# Patient Record
Sex: Female | Born: 1990 | Race: White | Hispanic: No | Marital: Single | Smoking: Current every day smoker
Health system: Southern US, Community
[De-identification: ages and names within clinical notes are randomized; demographics above are authoritative.]

## PROBLEM LIST (undated history)

## (undated) ENCOUNTER — Inpatient Hospital Stay: Admission: EM | Payer: Self-pay | Source: Home / Self Care

## (undated) ENCOUNTER — Inpatient Hospital Stay (HOSPITAL_COMMUNITY): Payer: Medicaid Other

## (undated) DIAGNOSIS — Z8673 Personal history of transient ischemic attack (TIA), and cerebral infarction without residual deficits: Secondary | ICD-10-CM

## (undated) DIAGNOSIS — F411 Generalized anxiety disorder: Secondary | ICD-10-CM

## (undated) DIAGNOSIS — F329 Major depressive disorder, single episode, unspecified: Secondary | ICD-10-CM

## (undated) DIAGNOSIS — J45909 Unspecified asthma, uncomplicated: Secondary | ICD-10-CM

## (undated) DIAGNOSIS — R0683 Snoring: Secondary | ICD-10-CM

## (undated) DIAGNOSIS — G5622 Lesion of ulnar nerve, left upper limb: Secondary | ICD-10-CM

## (undated) DIAGNOSIS — R825 Elevated urine levels of drugs, medicaments and biological substances: Secondary | ICD-10-CM

## (undated) DIAGNOSIS — R454 Irritability and anger: Secondary | ICD-10-CM

## (undated) DIAGNOSIS — R200 Anesthesia of skin: Secondary | ICD-10-CM

## (undated) DIAGNOSIS — E669 Obesity, unspecified: Secondary | ICD-10-CM

## (undated) HISTORY — DX: Snoring: R06.83

## (undated) HISTORY — DX: Personal history of transient ischemic attack (TIA), and cerebral infarction without residual deficits: Z86.73

## (undated) HISTORY — DX: Irritability and anger: R45.4

## (undated) HISTORY — DX: Generalized anxiety disorder: F41.1

## (undated) HISTORY — DX: Major depressive disorder, single episode, unspecified: F32.9

## (undated) HISTORY — DX: Lesion of ulnar nerve, left upper limb: G56.22

## (undated) HISTORY — DX: Elevated urine levels of drugs, medicaments and biological substances: R82.5

## (undated) HISTORY — DX: Obesity, unspecified: E66.9

## (undated) HISTORY — DX: Anesthesia of skin: R20.0

## (undated) HISTORY — PX: DILATION AND EVACUATION: SHX1459

---

## 2014-05-11 ENCOUNTER — Other Ambulatory Visit: Payer: Self-pay

## 2014-05-29 ENCOUNTER — Other Ambulatory Visit (HOSPITAL_COMMUNITY): Payer: Self-pay | Admitting: Obstetrics and Gynecology

## 2014-05-29 ENCOUNTER — Encounter (HOSPITAL_COMMUNITY): Payer: Self-pay

## 2014-05-29 ENCOUNTER — Ambulatory Visit (HOSPITAL_COMMUNITY)
Admission: RE | Admit: 2014-05-29 | Discharge: 2014-05-29 | Disposition: A | Discharge status: Home / Self Care | Payer: Medicaid Other | Source: Ambulatory Visit | Attending: Obstetrics and Gynecology | Admitting: Obstetrics and Gynecology

## 2014-05-29 VITALS — BP 121/76 | HR 82 | Wt 299.0 lb

## 2014-05-29 DIAGNOSIS — O283 Abnormal ultrasonic finding on antenatal screening of mother: Secondary | ICD-10-CM

## 2014-05-29 DIAGNOSIS — O358XX0 Maternal care for other (suspected) fetal abnormality and damage, not applicable or unspecified: Secondary | ICD-10-CM | POA: Insufficient documentation

## 2014-05-29 DIAGNOSIS — O289 Unspecified abnormal findings on antenatal screening of mother: Secondary | ICD-10-CM | POA: Insufficient documentation

## 2014-05-29 NOTE — Progress Notes (Signed)
MATERNAL FETAL MEDICINE CONSULT  Patient Name: Shelly Moyer Medical Record Number:  [id number] Date of Birth: 03/07/1991 Requesting Physician Name:  Hassell Doneraig Gaccione, MD Date of Service: 05/29/2014  Chief Complaint Fetal anencephaly  History of Present Illness Shelly Moyer was seen today secondary to fetal anencephaly at the request of Hassell Doneraig Gaccione, MD.  The patient is a 23 y.o. G2P0010,at 8069w1d with an EDD of 12/03/2014, by Ultrasound dating method.  This was discovered on a routine ultrasound performed in Hassell Doneraig Gaccione, MD office.  Anencephaly was [facility] on an [facility]  Review of Systems Pertinent items are noted in HPI.  Obstetrical History G1 TAB as a teenager G2 Current  Past Medical and Surgical History The patient asthma, but no other history of chronic medical diseases.  She has had several teeth extracted but no other prior surgeries.  Family History The patient has no family history of mental retardation, birth defects, or genetic diseases.  She does not know the family history of the father of the baby.  Social History The patient smokes, but does not use  alcohol or other illicit drugs.  Physical Examination Vitals - BP121/76, Pulse 82, Weight 299 lbs. General appearance - alert, well appearing, and in no distress Abdomen - soft, nontender, nondistended, no masses or organomegaly Extremities - no pedal edema noted  Assessment and Recommendations 1.  Fetal anencephaly.  The suspicions raised by Shelly Moyer prior ultrasound were confirmed on our ultrasound today.  I discussed the implications of these findings at length with Shelly Moyer and her mother, who accompanied her to today's visit.  She understands that this is a lethal condition, and that while fetuses with anencephaly typical survive until term they typically die within hours and days of birth if not provided with intensive life support measures.  We also discussed her  options of continuing or terminating the pregnancy.  She would like to proceed with termination of pregnancy via D&C.  I have discussed this case with Dr. Marice Potterove with the faculty practice.  She has arranged for Dr. Jolayne Pantheronstant [facility] coming Friday 06/01/14.  I completed the Women's Right to Know paperwork with Shelly Moyer today at 1300.  I spent 45 minutes with Shelly Moyer today of which 50% was face-to-face counseling.  [facility].  Please do not hesitate to contact us with questions.   Rema FendtNITSCHE,JOSHUA, MD

## 2014-05-31 ENCOUNTER — Encounter (HOSPITAL_COMMUNITY): Payer: Self-pay | Admitting: *Deleted

## 2014-06-01 ENCOUNTER — Ambulatory Visit (HOSPITAL_COMMUNITY): Payer: Medicaid Other | Admitting: Anesthesiology

## 2014-06-01 ENCOUNTER — Ambulatory Visit (HOSPITAL_COMMUNITY)
Admission: RE | Admit: 2014-06-01 | Discharge: 2014-06-01 | Disposition: A | Discharge status: Home / Self Care | Payer: Medicaid Other | Source: Ambulatory Visit | Attending: Obstetrics and Gynecology | Admitting: Obstetrics and Gynecology

## 2014-06-01 ENCOUNTER — Encounter (HOSPITAL_COMMUNITY)
Admission: RE | Disposition: A | Discharge status: Home / Self Care | Payer: Self-pay | Source: Ambulatory Visit | Attending: Obstetrics and Gynecology

## 2014-06-01 ENCOUNTER — Encounter (HOSPITAL_COMMUNITY): Payer: Medicaid Other | Admitting: Anesthesiology

## 2014-06-01 ENCOUNTER — Encounter (HOSPITAL_COMMUNITY): Payer: Self-pay | Admitting: Certified Registered"

## 2014-06-01 DIAGNOSIS — Z6841 Body Mass Index (BMI) 40.0 and over, adult: Secondary | ICD-10-CM | POA: Diagnosis not present

## 2014-06-01 DIAGNOSIS — F17200 Nicotine dependence, unspecified, uncomplicated: Secondary | ICD-10-CM | POA: Diagnosis not present

## 2014-06-01 DIAGNOSIS — O350XX0 Maternal care for (suspected) central nervous system malformation in fetus, not applicable or unspecified: Secondary | ICD-10-CM

## 2014-06-01 DIAGNOSIS — O350XX1 Maternal care for (suspected) central nervous system malformation in fetus, fetus 1: Secondary | ICD-10-CM

## 2014-06-01 DIAGNOSIS — Z332 Encounter for elective termination of pregnancy: Secondary | ICD-10-CM | POA: Diagnosis present

## 2014-06-01 DIAGNOSIS — O3500X0 Maternal care for (suspected) central nervous system malformation or damage in fetus, unspecified, not applicable or unspecified: Secondary | ICD-10-CM | POA: Insufficient documentation

## 2014-06-01 DIAGNOSIS — O039 Complete or unspecified spontaneous abortion without complication: Secondary | ICD-10-CM | POA: Diagnosis present

## 2014-06-01 DIAGNOSIS — O3502X1 Maternal care for (suspected) central nervous system malformation or damage in fetus, anencephaly, fetus 1: Secondary | ICD-10-CM

## 2014-06-01 DIAGNOSIS — J45909 Unspecified asthma, uncomplicated: Secondary | ICD-10-CM | POA: Diagnosis not present

## 2014-06-01 DIAGNOSIS — O3502X0 Maternal care for (suspected) central nervous system malformation or damage in fetus, anencephaly, not applicable or unspecified: Secondary | ICD-10-CM

## 2014-06-01 DIAGNOSIS — O021 Missed abortion: Secondary | ICD-10-CM

## 2014-06-01 HISTORY — DX: Unspecified asthma, uncomplicated: J45.909

## 2014-06-01 HISTORY — PX: DILATION AND EVACUATION: SHX1459

## 2014-06-01 LAB — CBC
HEMATOCRIT: 34.7 % — AB (ref 36.0–46.0)
HEMOGLOBIN: 11.8 g/dL — AB (ref 12.0–15.0)
MCH: 28.8 pg (ref 26.0–34.0)
MCHC: 34 g/dL (ref 30.0–36.0)
MCV: 84.6 fL (ref 78.0–100.0)
Platelets: 229 10*3/uL (ref 150–400)
RBC: 4.1 MIL/uL (ref 3.87–5.11)
RDW: 16.3 % — ABNORMAL HIGH (ref 11.5–15.5)
WBC: 11.4 10*3/uL — AB (ref 4.0–10.5)

## 2014-06-01 LAB — ABO/RH: ABO/RH(D): A POS

## 2014-06-01 SURGERY — DILATION AND EVACUATION, UTERUS, SECOND TRIMESTER
Anesthesia: Monitor Anesthesia Care | Site: Uterus

## 2014-06-01 MED ORDER — LIDOCAINE HCL (CARDIAC) 20 MG/ML IV SOLN
INTRAVENOUS | Status: DC | PRN
  Administered 2014-06-01: 80 mg via INTRAVENOUS

## 2014-06-01 MED ORDER — MIDAZOLAM HCL 2 MG/2ML IJ SOLN
INTRAMUSCULAR | Status: DC | PRN
  Administered 2014-06-01: 2 mg via INTRAVENOUS

## 2014-06-01 MED ORDER — LIDOCAINE HCL 1 % IJ SOLN
INTRAMUSCULAR | Status: DC | PRN
  Administered 2014-06-01: 10 mL

## 2014-06-01 MED ORDER — MIDAZOLAM HCL 2 MG/2ML IJ SOLN
INTRAMUSCULAR | Status: AC
  Filled 2014-06-01: qty 2

## 2014-06-01 MED ORDER — FENTANYL CITRATE 0.05 MG/ML IJ SOLN
INTRAMUSCULAR | Status: AC
  Administered 2014-06-01: 50 ug via INTRAVENOUS
  Filled 2014-06-01: qty 2

## 2014-06-01 MED ORDER — PROMETHAZINE HCL 25 MG/ML IJ SOLN: 6.2500 mg | INTRAMUSCULAR | Status: DC | PRN

## 2014-06-01 MED ORDER — MIDAZOLAM HCL 2 MG/2ML IJ SOLN: 0.5000 mg | Freq: Once | INTRAMUSCULAR | Status: DC | PRN

## 2014-06-01 MED ORDER — ONDANSETRON HCL 4 MG/2ML IJ SOLN
INTRAMUSCULAR | Status: AC
  Filled 2014-06-01: qty 2

## 2014-06-01 MED ORDER — OXYCODONE-ACETAMINOPHEN 5-325 MG PO TABS: 1.0000 | ORAL_TABLET | ORAL | Status: DC | PRN

## 2014-06-01 MED ORDER — KETOROLAC TROMETHAMINE 30 MG/ML IJ SOLN
INTRAMUSCULAR | Status: DC | PRN
  Administered 2014-06-01: 30 mg via INTRAVENOUS

## 2014-06-01 MED ORDER — PROPOFOL 10 MG/ML IV EMUL
INTRAVENOUS | Status: AC
  Filled 2014-06-01: qty 20

## 2014-06-01 MED ORDER — FENTANYL CITRATE 0.05 MG/ML IJ SOLN
INTRAMUSCULAR | Status: DC | PRN
  Administered 2014-06-01 (×2): 50 ug via INTRAVENOUS

## 2014-06-01 MED ORDER — MEPERIDINE HCL 25 MG/ML IJ SOLN: 6.2500 mg | INTRAMUSCULAR | Status: DC | PRN

## 2014-06-01 MED ORDER — LACTATED RINGERS IV SOLN
INTRAVENOUS | Status: DC
  Administered 2014-06-01: 09:00:00 via INTRAVENOUS

## 2014-06-01 MED ORDER — DEXAMETHASONE SODIUM PHOSPHATE 10 MG/ML IJ SOLN
INTRAMUSCULAR | Status: AC
  Filled 2014-06-01: qty 1

## 2014-06-01 MED ORDER — PROPOFOL 10 MG/ML IV EMUL
INTRAVENOUS | Status: DC | PRN
  Administered 2014-06-01 (×2): 30 mg via INTRAVENOUS
  Administered 2014-06-01: 40 mg via INTRAVENOUS
  Administered 2014-06-01: 30 mg via INTRAVENOUS
  Administered 2014-06-01: 20 mg via INTRAVENOUS
  Administered 2014-06-01 (×3): 30 mg via INTRAVENOUS
  Administered 2014-06-01: 40 mg via INTRAVENOUS
  Administered 2014-06-01 (×2): 30 mg via INTRAVENOUS

## 2014-06-01 MED ORDER — IBUPROFEN 600 MG PO TABS: 600.0000 mg | ORAL_TABLET | Freq: Four times a day (QID) | ORAL | Status: DC | PRN

## 2014-06-01 MED ORDER — FENTANYL CITRATE 0.05 MG/ML IJ SOLN
INTRAMUSCULAR | Status: AC
  Filled 2014-06-01: qty 2

## 2014-06-01 MED ORDER — FENTANYL CITRATE 0.05 MG/ML IJ SOLN
25.0000 ug | INTRAMUSCULAR | Status: DC | PRN
  Administered 2014-06-01 (×2): 50 ug via INTRAVENOUS

## 2014-06-01 MED ORDER — ONDANSETRON HCL 4 MG/2ML IJ SOLN
INTRAMUSCULAR | Status: DC | PRN
  Administered 2014-06-01: 4 mg via INTRAVENOUS

## 2014-06-01 MED ORDER — LIDOCAINE HCL (CARDIAC) 20 MG/ML IV SOLN
INTRAVENOUS | Status: AC
  Filled 2014-06-01: qty 5

## 2014-06-01 MED ORDER — DEXAMETHASONE SODIUM PHOSPHATE 10 MG/ML IJ SOLN
INTRAMUSCULAR | Status: DC | PRN
  Administered 2014-06-01: 10 mg via INTRAVENOUS

## 2014-06-01 MED ORDER — KETOROLAC TROMETHAMINE 30 MG/ML IJ SOLN
INTRAMUSCULAR | Status: AC
  Filled 2014-06-01: qty 1

## 2014-06-01 MED ORDER — LIDOCAINE HCL 1 % IJ SOLN
INTRAMUSCULAR | Status: AC
  Filled 2014-06-01: qty 20

## 2014-06-01 MED ORDER — KETOROLAC TROMETHAMINE 30 MG/ML IJ SOLN: 15.0000 mg | Freq: Once | INTRAMUSCULAR | Status: DC | PRN

## 2014-06-01 SURGICAL SUPPLY — 16 items
CLOTH BEACON ORANGE TIMEOUT ST (SAFETY) ×3 IMPLANT
CONT PATH 16OZ SNAP LID 3702 (MISCELLANEOUS) ×3 IMPLANT
DRAPE HYSTEROSCOPY (DRAPE) ×3 IMPLANT
GLOVE BIO SURGEON STRL SZ 6.5 (GLOVE) ×2 IMPLANT
GLOVE BIO SURGEONS STRL SZ 6.5 (GLOVE) ×1
GLOVE SURG SS PI 6.0 STRL IVOR (GLOVE) ×12 IMPLANT
GLOVE SURG SS PI 7.0 STRL IVOR (GLOVE) ×9 IMPLANT
GOWN STRL REUS W/TWL LRG LVL3 (GOWN DISPOSABLE) ×6 IMPLANT
KIT BERKELEY 2ND TRIMESTER 1/2 (COLLECTOR) ×3 IMPLANT
NS IRRIG 1000ML POUR BTL (IV SOLUTION) ×3 IMPLANT
PACK VAGINAL MINOR WOMEN LF (CUSTOM PROCEDURE TRAY) ×3 IMPLANT
PAD OB MATERNITY 4.3X12.25 (PERSONAL CARE ITEMS) ×3 IMPLANT
PAD PREP 24X48 CUFFED NSTRL (MISCELLANEOUS) ×3 IMPLANT
TOWEL OR 17X24 6PK STRL BLUE (TOWEL DISPOSABLE) ×6 IMPLANT
TUBE VACURETTE 2ND TRIMESTER (CANNULA) ×3 IMPLANT
VACURETTE 14MM CVD 1/2 BASE (CANNULA) ×3 IMPLANT

## 2014-06-01 NOTE — Op Note (Addendum)
Jennetta Woodroe ModeJohansen PROCEDURE DATE: 06/01/2014  PREOPERATIVE DIAGNOSIS: 13.3 week with anencephaly. POSTOPERATIVE DIAGNOSIS: The same. PROCEDURE:     Dilation and Evacuation. SURGEON:  Dr. Catalina AntiguaPeggy Loranda Mastel  INDICATIONS: 23 y.o. G2P0010 with lethal fetal anomaly (anencephaly) at 13.[redacted] weeks gestation, needing surgical completion.  Risks of surgery were discussed with the patient including but not limited to: bleeding which may require transfusion; infection which may require antibiotics; injury to uterus or surrounding organs;need for additional procedures including laparotomy or laparoscopy; possibility of intrauterine scarring which may impair future fertility; and other postoperative/anesthesia complications. Written informed consent was obtained.    FINDINGS:  A 14 size anteverted uterus, moderate amounts of products of conception, specimen sent to pathology.  ANESTHESIA:    Monitored intravenous sedation, paracervical block. INTRAVENOUS FLUIDS:  300 ml of LR ESTIMATED BLOOD LOSS:  Less than 20 ml. SPECIMENS:  Products of conception sent to pathology COMPLICATIONS:  None immediate.  PROCEDURE DETAILS:  The patient was taken to the operating room where general anesthesia was administered and was found to be adequate.  After an adequate timeout was performed, she was placed in the dorsal lithotomy position and examined; then prepped and draped in the sterile manner.   Her bladder was catheterized for an unmeasured amount of clear, yellow urine. A vaginal speculum was then placed in the patient's vagina and a single tooth tenaculum was applied to the anterior lip of the cervix.  A paracervical block using 0.5% Marcaine was administered. The cervix was gently dilated to accommodate a 14 mm suction curette that was gently advanced to the uterine fundus.  The suction device was then activated and curette slowly rotated to clear the uterus of products of conception.  A sharp curettage was then performed to  confirm complete emptying of the uterus. There was minimal bleeding noted and the tenaculum removed with good hemostasis noted.   All instruments were removed from the patient's vagina. The patient tolerated the procedure well and was taken to the recovery area awake, and in stable condition.  The patient will be discharged to home as per PACU criteria.  Routine postoperative instructions given.  She was prescribed Percocet and Ibuprofen.  She will follow up in the clinic in 2 weeks for postoperative evaluation.

## 2014-06-01 NOTE — Anesthesia Postprocedure Evaluation (Signed)
  Anesthesia Post Note  Patient: Shelly Moyer  Procedure(s) Performed: Procedure(s) (LRB): DILATATION AND EVACUATION (D&E) 2ND TRIMESTER (N/A)  Anesthesia type: mac   Patient location: PACU  Post pain: Pain level controlled  Post assessment: Post-op Vital signs reviewed  Last Vitals:  Filed Vitals:   06/01/14 1015  BP: 97/57  Pulse: 77  Temp:   Resp: 17    Post vital signs: Reviewed  Level of consciousness: awake  Complications: No apparent anesthesia complications

## 2014-06-01 NOTE — Transfer of Care (Signed)
Immediate Anesthesia Transfer of Care Note  Patient: Shelly Moyer  Procedure(s) Performed: Procedure(s): DILATATION AND EVACUATION (D&E) 2ND TRIMESTER (N/A)  Patient Location: PACU  Anesthesia Type:MAC  Level of Consciousness: awake, alert  and oriented  Airway & Oxygen Therapy: Patient Spontanous Breathing  Post-op Assessment: Report given to PACU RN, Post -op Vital signs reviewed and stable and Patient moving all extremities  Post vital signs: Reviewed and stable  Complications: No apparent anesthesia complications

## 2014-06-01 NOTE — Discharge Instructions (Signed)
Anencefalia  (Anencephaly)    La anencefalia es un defecto del tubo neural (NTD) que se produce durante el desarrollo fetal embrionario. El tubo neural es un canal angosto que est en la cabeza y la mdula espinal. En condiciones normales, se pliega y se cierra entre la tercera y la cuarta semana de Psychiatristembarazo. La anencefalia tiene lugar cuando el extremo de la cabeza del tubo neural (extremo ceflico) no se cierra. Por este motivo, una porcin muy importante del cerebro, el crneo y el cuero cabelludo no se desarrolla. Los bebs que tienen este trastorno nacen sin la parte frontal del encfalo (prosencfalo) y sin la parte relacionada con el pensamiento y la coordinacin (cerebro). El tejido cerebral restante a menudo no est cubierto por hueso ni piel (est expuesto).  CAUSAS  La causa es desconocida. Pueden tener que ver la dieta y el consumo de vitaminas de la Maustonmadre. [address]re los otros factores, podran incluirse:  LobbyistGentica (herencia).  Entorno (sustancias qumicas txicas, como plomo o mercurio).  Medicamentos para la epilepsia y la diabetes.  El hecho de vivir en determinadas partes del mundo.  Determinados orgenes tnicos. Estudios recientes han demostrado que agregar cido flico (vitamina B9) a la dieta de las mujeres en edad frtil puede reducir enormemente la incidencia de defectos del tubo neural debido al metabolismo anormal del folato. Se recomienda que todas las mujeres en edad frtil tomen cido flico a diario. La dosificacin habitual es de 400 microgramos (0,4 mg) al da antes de quedar embarazadas y de 4 mg al da antes de quedar embarazadas en el caso de las mujeres que ya tuvieron un hijo con un defecto del tubo neural.  SNTOMAS  Por lo general, un beb con este trastorno tiene los siguientes problemas:  Ceguera.  Sordera.  Falta de conciencia.  Incapacidad de Financial risk analystsentir dolor. Algunos nios con anencefalia pueden nacer con un tronco enceflico rudimentario. Sin embargo, la falta de  un cerebro que funcione descarta de forma permanente la posibilidad de que alguna vez recuperen la conciencia. Pueden tener acciones reflejas, como respirar, y respuestas al sonido o al tacto.  DIAGNSTICO  Se sospecha el diagnstico cuando la prueba de deteccin prenatal de alfa fetoprotena es anormal. Luego, se extrae lquido del saco amnitico (amniocentesis) y se hace una ecografa. Todas las embarazadas deben hacerse estudios prenatales para detectar posibles anomalas.  TRATAMIENTO  Este trastorno no tiene [place]ura ni tratamiento. No hay ningn tratamiento o dieta que sean de [place] Grenadinesayuda, y tampoco sirve hidratar al paciente con lquido o administrarle oxgeno. El tratamiento consiste en implementar medidas de apoyo. El resultado probable para los bebs que nacen con anencefalia es Spraymuy malo.  PREVENCIN  Por lo general, tomar cido flico (400 microgramos [0,4 mg]) todos los [address]  [address] antes de quedar embarazada evita los defectos del tubo neural. Esto no es 100 % eficaz. Las mujeres con alto riesgo de Warehouse managertener un beb con defectos del tubo neural deben tomar 4 mg de cido flico cada da antes de quedar embarazadas.  Document Released: 09/06/2013  [facility] Patient Information 2015 [place]ExitCare, [place]LLC. This information is not intended to replace advice given to you by your health care provider. Make sure you discuss any questions you have with your health care provider.   Dilatacin y curetaje o curetaje por aspiracin, cuidados posteriores  (Dilation and Curettage or Vacuum Curettage, Care After)    Estas indicaciones le proporcionan informacin acerca de cmo deber cuidarse despus del procedimiento. El mdico tambin podr darle instrucciones especficas. Comunquese con el mdico  si tiene algn problema o tiene preguntas despus del procedimiento.  CUIDADOS EN EL HOGAR  No conduzca durante 24 horas.  Espere 1 semana antes de realizar Anadark[employer] Petroleum Corporationactividades que la Land O'Lakescansen mucho.  Tmese la Chubb Corporationtemperatura dos (2) veces al  da, durante 4 Five Cornersdas. Antelas. Comunquese con su mdico si tiene fiebre.  No permanezca de pie durante mucho tiempo.  No levante, empuje ni jale objetos de ms de 10 libras (4,5 kilogramos).  Solo suba escaleras una o dos veces por da.  Descanse con frecuencia.  Siga con su dieta habitual.  Beba suficiente lquido para mantener el pis (orina) claro o de color amarillo plido.  Si tiene dificultad para Psychologist, sport and exercisemover el intestino (estreimiento), puede hacer lo siguiente:  Tome algn medicamento que la ayude a Licensed conveyancermover el intestino (laxante) segn las indicaciones de su mdico.  Consuma ms fruta y salvado.  Beba ms lquidos. ALLTEL Corporationome una ducha, no un bao de inmersin, durante el tiempo que le indique su mdico.  No practique natacin ni use el jacuzzi hasta que el mdico la autorice.  Pdale a alguien que se quede con usted durante 1 o 2 das despus del procedimiento.  No se haga duchas vaginales, no use tampones ni tenga sexo (relaciones sexuales) durante 2 semanas.  Solo tome los Chesapeake Energymedicamentos que le haya indicado su mdico. No tome aspirina. Puede ocasionar hemorragias.  Cumpla con los controles mdicos. SOLICITE AYUDA SI:  Tiene clicos o siente un dolor que no puede controlar con los medicamentos.  Siente dolor en el vientre (abdomen).  Advierte un olor ftido que proviene de la vagina.  Tiene una erupcin cutnea.  Tiene problemas con los medicamentos. SOLICITE AYUDA DE INMEDIATO SI:  Tiene una hemorragia ms abundante que un perodo normal.  Tiene fiebre.  Siente dolor en el pecho.  Tiene dificultad para respirar.  Se siente mareada o siente como si fuera a desmayarse (vahdo).  Se desmaya.  Siente dolor en la zona superior de los hombros.  Tiene una hemorragia vaginal, con o sin grumos de sangre (cogulos sanguneos). ASEGRESE DE QUE:  Comprende estas instrucciones.  Controlar su afeccin.  Recibir ayuda de inmediato si no mejora o si empeora. Document Released: 07/15/2011 Document  Revised: 11/21/2013  [facility]Exit[facility] Patient Information 2015 [place]ExitCare, MarylandLLC. This information is not intended to replace advice given to you by your health care provider. Make sure you discuss any questions you have with your health care provider.

## 2014-06-01 NOTE — H&P (Signed)
Kadisha Woodroe ModeJohansen is an 23 y.o. female G2P0010 at 653w3d diagnosed with fetal anencephaly here for dilatation and evacuation.       Past Medical History  Diagnosis Date  . Asthma     Past Surgical History  Procedure Laterality Date  . Dilation and evacuation      History reviewed. No pertinent family history.  Social History:  reports that she has been smoking Cigarettes.  She has been smoking about 0.50 packs per day. She does not have any smokeless tobacco history on file. She reports that she does not drink alcohol or use illicit drugs.  Allergies: No Known Allergies  No prescriptions prior to admission    Review of Systems  All other systems reviewed and are negative.   Blood pressure 128/70, pulse 82, temperature 98.4 F (36.9 C), temperature source Oral, resp. rate 18, height 5\' 9"  (1.753 m), weight 299 lb (135.626 kg), SpO2 97.00%. Physical Exam GENERAL: Well-developed, well-nourished female in no acute distress.  HEENT: Normocephalic, atraumatic. Sclerae anicteric.  NECK: Supple. Normal thyroid.  LUNGS: Clear to auscultation bilaterally.  HEART: Regular rate and rhythm. ABDOMEN: Soft, nontender, nondistended. No organomegaly. PELVIC: Deferred to OR EXTREMITIES: No cyanosis, clubbing, or edema, 2+ distal pulses.  Results for orders placed during the hospital encounter of 06/01/14 (from the past 24 hour(s))  CBC     Status: Abnormal   Collection Time    06/01/14  8:22 AM      Result Value Ref Range   WBC 11.4 (*) 4.0 - 10.5 K/uL   RBC 4.10  3.87 - 5.11 MIL/uL   Hemoglobin 11.8 (*) 12.0 - 15.0 g/dL   HCT 16.134.7 (*) 09.636.0 - 04.546.0 %   MCV 84.6  78.0 - 100.0 fL   MCH 28.8  26.0 - 34.0 pg   MCHC 34.0  30.0 - 36.0 g/dL   RDW 40.916.3 (*) 81.111.5 - 91.415.5 %   Platelets 229  150 - 400 K/uL    No results found.  Assessment/Plan: 23 yo G2P0010 at 463w3d with anencephaly requesting D&E - Risks, benefits and alternatives were explained including but not limites to risks of  bleeding, infection, uterine perforation, and damage to adjacent organs. Patient verbalized understanding and all questions were answered.  Shanvi Moyd 06/01/2014, 8:51 AM

## 2014-06-01 NOTE — Anesthesia Preprocedure Evaluation (Signed)
Anesthesia Evaluation  Patient identified by MRN, date of birth, ID band Patient awake    Reviewed: Allergy & Precautions, H&P , Patient's Chart, lab work & pertinent test results, reviewed documented beta blocker date and time   History of Anesthesia Complications Negative for: history of anesthetic complications  Airway Mallampati: II TM Distance: >3 FB Neck ROM: full    Dental   Pulmonary asthma , Current Smoker,  breath sounds clear to auscultation        Cardiovascular Exercise Tolerance: Good Rhythm:regular Rate:Normal     Neuro/Psych negative psych ROS   GI/Hepatic   Endo/Other  Morbid obesity  Renal/GU      Musculoskeletal   Abdominal   Peds  Hematology   Anesthesia Other Findings   Reproductive/Obstetrics                           Anesthesia Physical Anesthesia Plan  ASA: III and emergent  Anesthesia Plan: MAC   Post-op Pain Management:    Induction:   Airway Management Planned:   Additional Equipment:   Intra-op Plan:   Post-operative Plan:   Informed Consent: I have reviewed the patients History and Physical, chart, labs and discussed the procedure including the risks, benefits and alternatives for the proposed anesthesia with the patient or authorized representative who has indicated his/her understanding and acceptance.   Dental Advisory Given  Plan Discussed with: CRNA, Surgeon and Anesthesiologist  Anesthesia Plan Comments:         Anesthesia Quick Evaluation

## 2014-06-04 ENCOUNTER — Encounter (HOSPITAL_COMMUNITY): Payer: Self-pay | Admitting: Obstetrics and Gynecology

## 2014-06-15 ENCOUNTER — Ambulatory Visit: Payer: Medicaid Other | Admitting: Obstetrics and Gynecology

## 2014-06-15 ENCOUNTER — Telehealth: Payer: Self-pay

## 2014-06-15 NOTE — Telephone Encounter (Signed)
Called pt and left message with boyfriend, pt heard in background, to recall and schedule her postop appt.  He stated you would let her know.

## 2014-10-02 ENCOUNTER — Encounter (HOSPITAL_COMMUNITY): Payer: Self-pay | Admitting: Obstetrics and Gynecology

## 2015-04-03 ENCOUNTER — Encounter (HOSPITAL_COMMUNITY): Payer: Self-pay | Admitting: *Deleted

## 2021-01-20 DIAGNOSIS — I6389 Other cerebral infarction: Secondary | ICD-10-CM

## 2021-02-27 ENCOUNTER — Telehealth: Payer: Self-pay

## 2021-02-27 NOTE — Telephone Encounter (Signed)
NOTES ON FILE FROM [facility] [phone], SENT REFERRAL TO SCHEDULING

## 2021-03-26 ENCOUNTER — Encounter: Payer: Self-pay | Admitting: *Deleted

## 2021-03-27 ENCOUNTER — Ambulatory Visit: Payer: Self-pay | Admitting: Neurology

## 2021-03-27 ENCOUNTER — Encounter: Payer: Self-pay | Admitting: Neurology
